# Patient Record
Sex: Male | Born: 1980 | Race: Black or African American | Hispanic: No | Marital: Single | Smoking: Current every day smoker
Health system: Southern US, Community
[De-identification: ages and names within clinical notes are randomized; demographics above are authoritative.]

## PROBLEM LIST (undated history)

## (undated) DIAGNOSIS — F329 Major depressive disorder, single episode, unspecified: Secondary | ICD-10-CM

## (undated) DIAGNOSIS — F419 Anxiety disorder, unspecified: Secondary | ICD-10-CM

## (undated) DIAGNOSIS — F32A Depression, unspecified: Secondary | ICD-10-CM

---
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
## 2003-04-12 ENCOUNTER — Emergency Department (HOSPITAL_COMMUNITY): Admission: AD | Admit: 2003-04-12 | Discharge: 2003-04-12 | Payer: Self-pay | Admitting: Family Medicine

## 2019-01-06 ENCOUNTER — Encounter (HOSPITAL_COMMUNITY): Payer: Self-pay

## 2019-01-06 ENCOUNTER — Emergency Department (HOSPITAL_COMMUNITY)
Admission: EM | Admit: 2019-01-06 | Discharge: 2019-01-06 | Disposition: A | Discharge status: Home / Self Care | Payer: HRSA Program | Attending: Emergency Medicine | Admitting: Emergency Medicine

## 2019-01-06 ENCOUNTER — Other Ambulatory Visit: Payer: Self-pay

## 2019-01-06 DIAGNOSIS — F17210 Nicotine dependence, cigarettes, uncomplicated: Secondary | ICD-10-CM | POA: Insufficient documentation

## 2019-01-06 DIAGNOSIS — Z20828 Contact with and (suspected) exposure to other viral communicable diseases: Secondary | ICD-10-CM | POA: Diagnosis not present

## 2019-01-06 DIAGNOSIS — Z20822 Contact with and (suspected) exposure to covid-19: Secondary | ICD-10-CM

## 2019-01-06 HISTORY — DX: Anxiety disorder, unspecified: F41.9

## 2019-01-06 HISTORY — DX: Depression, unspecified: F32.A

## 2019-01-06 NOTE — ED Triage Notes (Signed)
Pt states he was around a friend last night who had symptoms of COVID and was sent to the hospital to be tested. Pt states the friend's COVID test results have not come back but he wants to be tested also. Pt denies any signs or symptoms of COVID.

## 2019-01-06 NOTE — Discharge Instructions (Signed)
You will be called with the results of your COVID test in approximately 3 days.

## 2019-01-06 NOTE — ED Provider Notes (Signed)
[facility] EMERGENCY DEPARTMENT Provider Note   CSN: [id number] Arrival date & time: 01/06/19  2006   History   Chief Complaint Needs COVID testing  HPI Darik Massing is a 38 y.o. male with no significant past medical history who presents for evaluation of needing to have a testing.  Patient states his coworker tested +3 days ago.  His work is requiring him to have a negative test before he can go back to work.  He denies fever, chills, nausea, vomiting, headache, congestion, rhinorrhea, sore throat, cough, loss of taste and smell, chest pain, shortness breath, abdominal pain, diarrhea dysuria.  Denies additional aggravating or alleviating factors.  History obtained from patient and past medical records.  No interpreter was used.     HPI  Past Medical History:  Diagnosis Date  . Anxiety   . Depression     There are no active problems to display for this patient.   Past Surgical History:  Procedure Laterality Date  . HERNIA REPAIR          Home Medications    Prior to Admission medications   Not on File    Family History History reviewed. No pertinent family history.  Social History Social History   Tobacco Use  . Smoking status: Current Every Day Smoker    Types: Cigarettes  . Smokeless tobacco: Never Used  Substance Use Topics  . Alcohol use: Yes    Comment: occassional   . Drug use: Yes    Types: Cocaine, Marijuana     Allergies   Patient has no known allergies.   Review of Systems Review of Systems  Constitutional: Negative.   HENT: Negative.   Eyes: Negative.   Respiratory: Negative.   Cardiovascular: Negative.   Gastrointestinal: Negative.   Genitourinary: Negative.   Musculoskeletal: Negative.   Skin: Negative.   Neurological: Negative.   All other systems reviewed and are negative.    Physical Exam Updated Vital Signs BP 116/79   Pulse 80   Temp 98.3 F (36.8 C) (Oral)   Resp 16   SpO2 97%   Physical  Exam Vitals signs and nursing note reviewed.  Constitutional:      General: He is not in acute distress.    Appearance: He is well-developed. He is not ill-appearing, toxic-appearing or diaphoretic.  HENT:     Head: Normocephalic and atraumatic.     Nose: Nose normal.     Mouth/Throat:     Mouth: Mucous membranes are moist.     Pharynx: Oropharynx is clear.  Eyes:     Pupils: Pupils are equal, round, and reactive to light.  Neck:     Musculoskeletal: Normal range of motion and neck supple.  Cardiovascular:     Rate and Rhythm: Normal rate and regular rhythm.     Pulses: Normal pulses.     Heart sounds: Normal heart sounds.  Pulmonary:     Effort: Pulmonary effort is normal. No respiratory distress.     Breath sounds: Normal breath sounds.  Abdominal:     General: Bowel sounds are normal. There is no distension.     Palpations: Abdomen is soft.  Musculoskeletal: Normal range of motion.  Skin:    General: Skin is warm and dry.  Neurological:     Mental Status: He is alert.    ED Treatments / Results  Labs (all labs ordered are listed, but only abnormal results are displayed) Labs Reviewed  NOVEL CORONAVIRUS, NAA (HOSP ORDER, SEND-OUT  TO REF LAB; TAT 18-24 HRS)    EKG None  Radiology No results found.  Procedures Procedures (including critical care time)  Medications Ordered in ED Medications - No data to display  Initial Impression / Assessment and Plan / ED Course  I have reviewed the triage vital signs and the nursing notes.  Pertinent labs & imaging results that were available during my care of the patient were reviewed by me and considered in my medical decision making (see chart for details).  38 year old male appears otherwise well presents for evaluation of needing COVID test.  He is afebrile, nonseptic, non-ill-appearing.  Recent exposure with COVID positive patient at work 3 days ago.  He currently denies any symptoms.  Heart and lungs clear.  Tolerating  p.o. intake without difficulty.  Will do outpatient COVID test.  Discussed return precautions with patient. Patient to return for any new or worsening symptoms or he can follow-up with his PCP.       Treasa SchoolJames Moree was evaluated in Emergency Department on 01/06/2019 for the symptoms described in the history of present illness. He was evaluated in the context of the global COVID-19 pandemic, which necessitated consideration that the patient might be at risk for infection with the SARS-CoV-2 virus that causes COVID-19. Institutional protocols and algorithms that pertain to the evaluation of patients at risk for COVID-19 are in a state of rapid change based on information released by regulatory bodies including the CDC and federal and state organizations. These policies and algorithms were followed during the patient's care in the ED. Final Clinical Impressions(s) / ED Diagnoses   Final diagnoses:  Exposure to Covid-19 Virus    ED Discharge Orders    None       Payten Beaumier A, PA-C 01/06/19 2057    Maia PlanLong, Joshua G, MD 01/07/19 [phone]

## 2019-01-08 LAB — NOVEL CORONAVIRUS, NAA (HOSP ORDER, SEND-OUT TO REF LAB; TAT 18-24 HRS): SARS-CoV-2, NAA: NOT DETECTED

## 2019-03-02 ENCOUNTER — Observation Stay (HOSPITAL_COMMUNITY)
Admission: AD | Admit: 2019-03-02 | Discharge: 2019-03-03 | Disposition: A | Discharge status: Home / Self Care | Payer: Self-pay | Source: Intra-hospital | Attending: Psychiatry | Admitting: Psychiatry

## 2019-03-02 ENCOUNTER — Other Ambulatory Visit: Payer: Self-pay

## 2019-03-02 ENCOUNTER — Encounter (HOSPITAL_COMMUNITY): Payer: Self-pay | Admitting: Emergency Medicine

## 2019-03-02 ENCOUNTER — Emergency Department (HOSPITAL_COMMUNITY)
Admission: EM | Admit: 2019-03-02 | Discharge: 2019-03-02 | Disposition: A | Discharge status: Other Acute Inpatient Hospital | Payer: Self-pay | Attending: Emergency Medicine | Admitting: Emergency Medicine

## 2019-03-02 ENCOUNTER — Other Ambulatory Visit: Payer: Self-pay | Admitting: Family

## 2019-03-02 ENCOUNTER — Emergency Department (HOSPITAL_COMMUNITY): Payer: Self-pay

## 2019-03-02 DIAGNOSIS — R0789 Other chest pain: Secondary | ICD-10-CM | POA: Insufficient documentation

## 2019-03-02 DIAGNOSIS — F1994 Other psychoactive substance use, unspecified with psychoactive substance-induced mood disorder: Secondary | ICD-10-CM | POA: Diagnosis present

## 2019-03-02 DIAGNOSIS — F17210 Nicotine dependence, cigarettes, uncomplicated: Secondary | ICD-10-CM | POA: Insufficient documentation

## 2019-03-02 DIAGNOSIS — T43222A Poisoning by selective serotonin reuptake inhibitors, intentional self-harm, initial encounter: Secondary | ICD-10-CM | POA: Insufficient documentation

## 2019-03-02 DIAGNOSIS — F1910 Other psychoactive substance abuse, uncomplicated: Secondary | ICD-10-CM | POA: Insufficient documentation

## 2019-03-02 DIAGNOSIS — Z20828 Contact with and (suspected) exposure to other viral communicable diseases: Secondary | ICD-10-CM | POA: Insufficient documentation

## 2019-03-02 DIAGNOSIS — Z046 Encounter for general psychiatric examination, requested by authority: Secondary | ICD-10-CM | POA: Insufficient documentation

## 2019-03-02 DIAGNOSIS — F329 Major depressive disorder, single episode, unspecified: Secondary | ICD-10-CM | POA: Insufficient documentation

## 2019-03-02 DIAGNOSIS — G4700 Insomnia, unspecified: Secondary | ICD-10-CM | POA: Insufficient documentation

## 2019-03-02 DIAGNOSIS — Z79899 Other long term (current) drug therapy: Secondary | ICD-10-CM | POA: Insufficient documentation

## 2019-03-02 DIAGNOSIS — F419 Anxiety disorder, unspecified: Secondary | ICD-10-CM | POA: Insufficient documentation

## 2019-03-02 DIAGNOSIS — F332 Major depressive disorder, recurrent severe without psychotic features: Principal | ICD-10-CM | POA: Diagnosis present

## 2019-03-02 DIAGNOSIS — T50902A Poisoning by unspecified drugs, medicaments and biological substances, intentional self-harm, initial encounter: Secondary | ICD-10-CM | POA: Insufficient documentation

## 2019-03-02 DIAGNOSIS — X58XXXA Exposure to other specified factors, initial encounter: Secondary | ICD-10-CM | POA: Insufficient documentation

## 2019-03-02 LAB — BASIC METABOLIC PANEL
Anion gap: 14 (ref 5–15)
BUN: 15 mg/dL (ref 6–20)
CO2: 21 mmol/L — ABNORMAL LOW (ref 22–32)
Calcium: 9.3 mg/dL (ref 8.9–10.3)
Chloride: 100 mmol/L (ref 98–111)
Creatinine, Ser: 1.2 mg/dL (ref 0.61–1.24)
GFR calc Af Amer: >60 mL/min (ref >60)
GFR calc non Af Amer: >60 mL/min (ref >60)
Glucose, Bld: 74 mg/dL (ref 70–99)
Potassium: 4.6 mmol/L (ref 3.5–5.1)
Sodium: 135 mmol/L (ref 135–145)

## 2019-03-02 LAB — SARS CORONAVIRUS 2 BY RT PCR (HOSPITAL ORDER, PERFORMED IN ~~LOC~~ HOSPITAL LAB): SARS Coronavirus 2: NEGATIVE

## 2019-03-02 LAB — CBC
HCT: 43.3 % (ref 39.0–52.0)
Hemoglobin: 14.8 g/dL (ref 13.0–17.0)
MCH: 32.3 pg (ref 26.0–34.0)
MCHC: 34.2 g/dL (ref 30.0–36.0)
MCV: 94.5 fL (ref 80.0–100.0)
Platelets: 220 10*3/uL (ref 150–400)
RBC: 4.58 MIL/uL (ref 4.22–5.81)
RDW: 12.5 % (ref 11.5–15.5)
WBC: 13.1 10*3/uL — ABNORMAL HIGH (ref 4.0–10.5)
nRBC: 0 % (ref 0.0–0.2)

## 2019-03-02 LAB — ACETAMINOPHEN LEVEL: Acetaminophen (Tylenol), Serum: <10 ug/mL — ABNORMAL LOW (ref 10–30)

## 2019-03-02 LAB — HEPATIC FUNCTION PANEL
ALT: 50 U/L — ABNORMAL HIGH (ref 0–44)
AST: 40 U/L (ref 15–41)
Albumin: 4.3 g/dL (ref 3.5–5.0)
Alkaline Phosphatase: 71 U/L (ref 38–126)
Bilirubin, Direct: 0.2 mg/dL (ref 0.0–0.2)
Indirect Bilirubin: 1.2 mg/dL — ABNORMAL HIGH (ref 0.3–0.9)
Total Bilirubin: 1.4 mg/dL — ABNORMAL HIGH (ref 0.3–1.2)
Total Protein: 7.3 g/dL (ref 6.5–8.1)

## 2019-03-02 LAB — ETHANOL: Alcohol, Ethyl (B): <10 mg/dL (ref <10)

## 2019-03-02 LAB — MAGNESIUM: Magnesium: 2.2 mg/dL (ref 1.7–2.4)

## 2019-03-02 LAB — RAPID URINE DRUG SCREEN, HOSP PERFORMED
Amphetamines: NOT DETECTED
Barbiturates: NOT DETECTED
Benzodiazepines: NOT DETECTED
Cocaine: POSITIVE — AB
Opiates: NOT DETECTED
Tetrahydrocannabinol: POSITIVE — AB

## 2019-03-02 LAB — SALICYLATE LEVEL: Salicylate Lvl: <7 mg/dL (ref 2.8–30.0)

## 2019-03-02 LAB — TROPONIN I (HIGH SENSITIVITY): Troponin I (High Sensitivity): 5 ng/L (ref <18)

## 2019-03-02 MED ORDER — ACETAMINOPHEN 325 MG PO TABS: 650.0000 mg | ORAL_TABLET | Freq: Four times a day (QID) | ORAL | Status: DC | PRN

## 2019-03-02 MED ORDER — TRAZODONE HCL 100 MG PO TABS
100.0000 mg | ORAL_TABLET | Freq: Every evening | ORAL | Status: DC | PRN
  Administered 2019-03-02: 100 mg via ORAL
  Filled 2019-03-02: qty 1

## 2019-03-02 MED ORDER — SODIUM CHLORIDE 0.9% FLUSH: 3.0000 mL | Freq: Once | INTRAVENOUS | Status: DC

## 2019-03-02 MED ORDER — ALUM & MAG HYDROXIDE-SIMETH 200-200-20 MG/5ML PO SUSP: 30.0000 mL | ORAL | Status: DC | PRN

## 2019-03-02 MED ORDER — MAGNESIUM HYDROXIDE 400 MG/5ML PO SUSP: 30.0000 mL | Freq: Every day | ORAL | Status: DC | PRN

## 2019-03-02 MED ORDER — HYDROXYZINE HCL 25 MG PO TABS
25.0000 mg | ORAL_TABLET | Freq: Three times a day (TID) | ORAL | Status: DC | PRN
  Administered 2019-03-02: 25 mg via ORAL
  Filled 2019-03-02: qty 1

## 2019-03-02 NOTE — BH Assessment (Signed)
Tele Assessment Note   Patient Name: Alexander Garrison MRN: [id number] Referring Physician: Ward Location of Patient: [facility] Location of Provider: [place]  Fabio Wah is an 38 y.o. male who presented to [facility] with suicidal ideation after ingesting 100 mg of Celexa in a suicide attempt.  He stated that he "wanted to stop the pain."  Patient states that he has a history of depression with two prior suicide attempts and states that he was hospitalized at [facility] in February of this year.  Patient states that he was clean and sober for eight weeks, but states that he relapsed this week and he has used methamphetamine and cocaine as well as alcohol and marijuana.  Patient states that he was intoxicated on drugs last night which worsened his depression and he just wanted "to end it all."  Patient states that since he as arrived at the hospital that he is feeling better, but is still unable to contract for safety.  Patient denies HI/Psychosis.  He states that he was clean and sober for eight weeks prior to relapsing this week.  He states that he has been using cocaine, methamphetamine, alcohol and marijuana and his last use was last pm.  Patient states that he has no history of abuse or self-mutilation.  He states that he has not been sleeping more than five hours a night.  Patient denies any appetite disturbance.  Patient states that he is single and lives with three male roommates.  He states that he has never been married, but states that he has two sons.  Patient states that he is employed by [employer].  He states that he has pending B&E charges and Possession of a Firearm by a Felon and has court somtime in November, but he is unsure of the court date.  Patient presented as alert and oriented.  His mood and affect depressed.  He did not appear to be responding to any internal stimuli.  Patient had impaired judgment, insight and impulse control.  His thoughts were  organized and his memory intact.  His speech was coherent and his eye contact good.  His psycho-motor activity was normal.  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 Anxiety   . Depression     Past Surgical History:  Procedure Laterality Date  . HERNIA REPAIR      Family History: No family history on file.  Social History:  reports that he has been smoking cigarettes. He has never used smokeless tobacco. He reports current alcohol use. He reports current drug use. Drugs: Cocaine, Marijuana, and Methamphetamines.  Additional Social History:  Alcohol / Drug Use Pain Medications: see MAR Prescriptions: see MAR Over the Counter: see MAR History of alcohol / drug use?: Yes Longest period of sobriety (when/how long): has been clean for 8 weeks in the past Substance #1 Name of Substance 1: Methamphetamine 1 - Age of First Use: 38 1 - Amount (size/oz): $20 1 - Frequency: states that he has only used on two occasions 1 - Duration: since onset 1 - Last Use / Amount: last pm Substance #2 Name of Substance 2: Cocaine 2 - Age of First Use: 17 2 - Amount (size/oz): $50-60 2 - Frequency: 1-2 times weekly 2 - Duration: since onset 2 - Last Use / Amount: last pm Substance #3 Name of Substance 3: THC 3 - Age of First  Use: 17 3 - Amount (size/oz): 2-3 joints daily 3 - Frequency: daily 3 - Duration: since onset 3 - Last Use / Amount: yesterday Substance #4 Name of Substance 4: alcohol 4 - Age of First Use: 17 4 - Amount (size/oz): 2 beers 4 - Frequency: 1-2 times weekly 4 - Duration: since onset 4 - Last Use / Amount: yesterday  CIWA: CIWA-Ar BP: 113/69 Pulse Rate: 88 COWS:    Allergies: No Known Allergies  Home Medications: (Not in a hospital admission)   OB/GYN Status:  No LMP for male patient.  General Assessment Data Assessment unable to be  completed: Yes Reason for not completing assessment: (from previous shift) [facility] TTS Assessment: In system Is this a Tele or Face-to-Face Assessment?: Tele Assessment Is this an Initial Assessment or a Re-assessment for this encounter?: Initial Assessment Patient Accompanied by:: N/A Language Other than English: No Living Arrangements: Other (Comment)(lives in apt with three other men) What gender do you identify as?: Male Marital status: Single Living Arrangements: Non-relatives/Friends Can pt return to current living arrangement?: Yes Admission Status: Voluntary Is patient capable of signing voluntary admission?: Yes Referral Source: Self/Family/Friend Insurance type: none     Crisis Care Plan Living Arrangements: Non-relatives/Friends Legal Guardian: Other:(self) Name of Psychiatrist: none Name of Therapist: none  Education Status Is patient currently in school?: No Is the patient employed, unemployed or receiving disability?: Employed  Risk to self with the past 6 months Suicidal Ideation: Yes-Currently Present Has patient been a risk to self within the past 6 months prior to admission? : Yes Suicidal Intent: Yes-Currently Present Has patient had any suicidal intent within the past 6 months prior to admission? : Yes Is patient at risk for suicide?: Yes Suicidal Plan?: Yes-Currently Present Has patient had any suicidal plan within the past 6 months prior to admission? : Yes Specify Current Suicidal Plan: overdose Access to Means: Yes Specify Access to Suicidal Means: RX Medications What has been your use of drugs/alcohol within the last 12 months?: almost daily use Previous Attempts/Gestures: Yes How many times?: 2 Other Self Harm Risks: (drug and alcohol problem) Triggers for Past Attempts: None known Intentional Self Injurious Behavior: None Family Suicide History: No Recent stressful life event(s): (none  reported) Persecutory voices/beliefs?: No Depression: Yes Depression Symptoms: Despondent, Insomnia, Isolating, Guilt, Loss of interest in usual pleasures, Feeling worthless/self pity Substance abuse history and/or treatment for substance abuse?: No Suicide prevention information given to non-admitted patients: Not applicable  Risk to Others within the past 6 months Homicidal Ideation: No Does patient have any lifetime risk of violence toward others beyond the six months prior to admission? : No Thoughts of Harm to Others: No Current Homicidal Intent: No Current Homicidal Plan: No Access to Homicidal Means: No Identified Victim: none History of harm to others?: No Assessment of Violence: None Noted Violent Behavior Description: none Does patient have access to weapons?: No Criminal Charges Pending?: Yes Describe Pending Criminal Charges: B&E and possess Firearm by a Felon Does patient have a court date: Yes Court Date: (sometime in November) Is patient on probation?: No  Psychosis Hallucinations: None noted Delusions: None noted  Mental Status Report Appearance/Hygiene: Unremarkable Eye Contact: Good Motor Activity: Freedom of movement Speech: Logical/coherent Level of Consciousness: Alert Mood: Depressed Affect: Depressed Anxiety Level: Minimal Thought Processes: Coherent, Relevant Judgement: Impaired Orientation: Person, Place, Time, Situation Obsessive Compulsive Thoughts/Behaviors: None  Cognitive Functioning Concentration: Decreased Memory: Recent Intact, Remote Intact Is patient IDD: No Insight: Fair  Impulse Control: Fair Appetite: Good Have you had any weight changes? : No Change Sleep: Decreased Total Hours of Sleep: 5 Vegetative Symptoms: None  ADLScreening [facility] Assessment Services) Patient's cognitive ability adequate to safely complete daily activities?: Yes Patient able to express need for assistance with ADLs?: Yes Independently performs ADLs?:  Yes (appropriate for developmental age)  Prior Inpatient Therapy Prior Inpatient Therapy: Yes Prior Therapy Dates: 06/2018 Prior Therapy Facilty/Provider(s): [facility] Reason for Treatment: depression/SI  Prior Outpatient Therapy Prior Outpatient Therapy: No Does patient have an ACCT team?: No Does patient have Intensive In-House Services?  : No Does patient have [facility] services? : No Does patient have P4CC services?: No  ADL Screening (condition at time of admission) Patient's cognitive ability adequate to safely complete daily activities?: Yes Is the patient deaf or have difficulty hearing?: No Does the patient have difficulty seeing, even when wearing glasses/contacts?: No Does the patient have difficulty concentrating, remembering, or making decisions?: No Patient able to express need for assistance with ADLs?: Yes Does the patient have difficulty dressing or bathing?: No Independently performs ADLs?: Yes (appropriate for developmental age) Does the patient have difficulty walking or climbing stairs?: No Weakness of Legs: None Weakness of Arms/Hands: None  Home Assistive Devices/Equipment Home Assistive Devices/Equipment: None  Therapy Consults (therapy consults require a physician order) PT Evaluation Needed: No OT Evalulation Needed: No SLP Evaluation Needed: No Abuse/Neglect Assessment (Assessment to be complete while patient is alone) Abuse/Neglect Assessment Can Be Completed: Yes Physical Abuse: Denies Verbal Abuse: Denies Sexual Abuse: Denies Exploitation of patient/patient's resources: Denies Self-Neglect: Denies Values / Beliefs Cultural Requests During Hospitalization: None Spiritual Requests During Hospitalization: None Consults Spiritual Care Consult Needed: No Social Work Consult Needed: No Merchant navy officer (For Healthcare) Does Patient Have a Medical Advance Directive?: No Would patient like information on creating a medical advance directive?: No -  Patient declined Nutrition Screen- [facility] Adult/[facility]/AP Has the patient recently lost weight without trying?: No Has the patient been eating poorly because of a decreased appetite?: No Malnutrition Screening Tool Score: 0        Disposition: Per Dr. Lucianne Muss and Malachy Chamber, NP, overnight OBS is recommended Disposition Initial Assessment Completed for this Encounter: Yes  This service was provided via telemedicine using a 2-way, interactive audio and video technology.  Names of all persons participating in this telemedicine service and their role in this encounter. Name: Treasa School Role: patient  Name: Dannielle Huh Rylie Knierim Role: TTS  Name:  Role:   Name:  Role:     Daphene Calamity 03/02/2019 9:57 AM

## 2019-03-02 NOTE — Plan of Care (Signed)
[place] [facility] Crisis Plan  Reason for Crisis Plan:  Crisis Stabilization   Plan of Care:  Referral for Inpatient Hospitalization  Family Support:      Current Living Environment:     Insurance:   Hospital Account    Name Acct ID Class Status Primary Coverage   [place], [place] [id number] [place] Open None        Guarantor Account (for Hospital Account [id number])    Name Relation to Pt Service Area Active? Acct Type   Lyndon Code [facility]   Address Phone       [address] [place], [place] [address] [phone])          Coverage Information (for Hospital Account [id number])    Not on file      Legal Guardian:     Primary Care Provider:  Patient, No Pcp Per  Current Outpatient Providers:  Hampton Abbot, MD  Psychiatrist:     Counselor/Therapist:     Compliant with Medications:  Yes  Additional Information:   Jesusita Oka 10/23/20208:55 PM

## 2019-03-02 NOTE — Discharge Instructions (Signed)
Go to behavioral health for admission.

## 2019-03-02 NOTE — ED Provider Notes (Addendum)
TIME SEEN: 6:00 AM  CHIEF COMPLAINT: Intentional overdose  HPI: Patient is a 38 year old male with history of depression and anxiety who presents to the emergency department after an intentional overdose.  States last night he took 5 Celexa tablets that were 20 mg each around 11 PM in an attempt to "stop all of the pain".  He denies that it was a suicide attempt.  He states he is feeling better now but has felt very depressed recently.  States he also used cocaine and methamphetamine around the same time and then developed left-sided chest pain that he is unable to describe.  Chest pain is also resolved.  No associated shortness of breath, lightheadedness, nausea or vomiting, diaphoresis.  No recent fevers, cough, diarrhea.  ROS: See HPI Constitutional: no fever  Eyes: no drainage  ENT: no runny nose   Cardiovascular:   chest pain  Resp: no SOB  GI: no vomiting GU: no dysuria Integumentary: no rash  Allergy: no hives  Musculoskeletal: no leg swelling  Neurological: no slurred speech ROS otherwise negative  PAST MEDICAL HISTORY/PAST SURGICAL HISTORY:  Past Medical History:  Diagnosis Date  . Anxiety   . Depression     MEDICATIONS:  Prior to Admission medications   Not on File    ALLERGIES:  No Known Allergies  SOCIAL HISTORY:  Social History   Tobacco Use  . Smoking status: Current Every Day Smoker    Types: Cigarettes  . Smokeless tobacco: Never Used  Substance Use Topics  . Alcohol use: Yes    Comment: occassional     FAMILY HISTORY: No family history on file.  EXAM: BP 113/69 (BP Location: Left Arm)   Pulse 88   Temp 98.2 F (36.8 C) (Oral)   Resp 20   SpO2 97%  CONSTITUTIONAL: Alert and oriented and responds appropriately to questions. Well-appearing; well-nourished HEAD: Normocephalic EYES: Conjunctivae clear, pupils appear equal, EOMI ENT: normal nose; moist mucous membranes NECK: Supple, no meningismus, no nuchal rigidity, no LAD  CARD: RRR; S1 and  S2 appreciated; no murmurs, no clicks, no rubs, no gallops RESP: Normal chest excursion without splinting or tachypnea; breath sounds clear and equal bilaterally; no wheezes, no rhonchi, no rales, no hypoxia or respiratory distress, speaking full sentences ABD/GI: Normal bowel sounds; non-distended; soft, non-tender, no rebound, no guarding, no peritoneal signs, no hepatosplenomegaly BACK:  The back appears normal and is non-tender to palpation, there is no CVA tenderness EXT: Normal ROM in all joints; non-tender to palpation; no edema; normal capillary refill; no cyanosis, no calf tenderness or swelling    SKIN: Normal color for age and race; warm; no rash NEURO: Moves all extremities equally PSYCH: Flat affect.  Denies SI, HI or hallucinations currently.  MEDICAL DECISION MAKING: Patient here after an intentional overdose.  Discussed with poison control.  Patient medically cleared 6 hours after ingestion as long as labs, EKG normal.  Poison control recommends looking for QRS and QT prolongation.  EKG today is normal.  There is also no ischemic change.  Low suspicion for ACS, PE or dissection.  Once medically cleared, will consult TTS.  ED PROGRESS: Patient's labs are unremarkable.  No QRS or QT prolongation on EKG.  He is now medically cleared and awaiting TTS evaluation.  Patient is here voluntarily.  Suspect patient will need admission to behavioral health hospital for psychiatric treatment given this was an unintentional overdose although he denies that it was a suicide attempt.  Will obtain rapid Covid swab.  Troponin  negative.  No longer having chest pain.  Suspect related to his illicit drug use.  I do not feel he needs a second troponin given chest pain started 7 hours ago.  Chest x-ray clear without edema, pneumonia, pneumothorax.  I reviewed all nursing notes and pertinent previous records as available.  I have interpreted any EKGs, lab and urine results, imaging (as available).    EKG  Interpretation  Date/Time:  Friday March 02 2019 06:05:47 EDT Ventricular Rate:  89 PR Interval:  160 QRS Duration: 88 QT Interval:  390 QTC Calculation: 474 R Axis:   62 Text Interpretation:  Normal sinus rhythm Normal ECG No old tracing to compare Confirmed by Samier Jaco, Cyril Mourning [phone]) on 03/02/2019 6:19:49 AM        Alexander Garrison was evaluated in Emergency Department on 03/02/2019 for the symptoms described in the history of present illness. He was evaluated in the context of the global COVID-19 pandemic, which necessitated consideration that the patient might be at risk for infection with the SARS-CoV-2 virus that causes COVID-19. Institutional protocols and algorithms that pertain to the evaluation of patients at risk for COVID-19 are in a state of rapid change based on information released by regulatory bodies including the CDC and federal and state organizations. These policies and algorithms were followed during the patient's care in the ED.    Alexander Garrison, Delice Bison, DO 03/02/19 5400    Alexander Garrison, Delice Bison, DO 03/02/19 [id number]

## 2019-03-02 NOTE — ED Notes (Signed)
Lunch Tray Ordered 

## 2019-03-02 NOTE — Progress Notes (Signed)
Patient ID: Alexander Garrison, male   DOB: [birth year], 38 y.o.   MRN: [id number] Pt A&O x 4, transfer from [place], presents with SI, after ingesting 100 mg of Celexa.,  Pt states he wanted to stop the pain.  Pt with history of 2 previous SI attempts.  Pt was clean & Sober for eith weeks, relapsed this week, on alcohol, marijuana, Meth and Cocaine.  Skin search completed.  Pt calm & cooperative.  Monitoring for safety.

## 2019-03-02 NOTE — ED Notes (Signed)
Pt changed into burgundy tshirt, has pants on due to no paper scrubs available

## 2019-03-02 NOTE — ED Notes (Signed)
PT to purple for TTS

## 2019-03-02 NOTE — ED Notes (Signed)
Patient was given 324mg  ASA and 2 nitros en route to ED.

## 2019-03-02 NOTE — Consult Note (Signed)
Telepsych Consultation   Reason for Consult:  overdose Referring Physician:  EDP Location of Patient: [facility] Location of Provider: Behavioral Health TTS Department  Patient Identification: Alexander Garrison MRN:  [id number] Principal Diagnosis: <principal problem not specified> Diagnosis:  Active Problems:   * No active hospital problems. *   Total Time spent with patient: 30 minutes  Subjective:   Alexander Garrison is a 38 y.o. male patient admitted with intentional overdose on Celexa.  Patient states quotation marks I took a lot of medication.  I was on drugs and tried to hurt myself I reckoned.  I was suicidal for about 2 days..  Patient with history of depression, anxiety, and polysubstance abuse.  He reports being stable and is currently living in an apartment that is provided by a sober living of [place].  He works at [employer] in [place].  He reports prior to this incident he had been clean for 8 weeks, and relapsed.  He does identify multiple stressors to include fussing with his significant other, legal charges, substance relapse, and depression.  He has 2 previous suicide attempts both overdose.  1 previous overdose was on medication, second overdose was on heroin that required medical interventions to include Narcan and CPR.  He reports relapsing on about $50-$60 worth of cocaine, and after the argument with his significant other he decided to take the medication.  Patient does give more information about his legal charges to include breaking and entering, stolen Zenaida Niecevan, firearm by a felon.  He states he presented to the hospital to get back started on his medications and stay clean.  At this time he is denying any suicidal, homicidal, and or auditory visual hallucinations.  HPI:  Alexander Garrison is an 38 y.o. male who presented to [facility] with suicidal ideation after ingesting 100 mg of Celexa in a suicide attempt.  He stated that he "wanted to stop the pain."  Patient states that he has a history of  depression with two prior suicide attempts and states that he was [facility] this [facility].  Patient states that he was clean and sober for eight weeks, but states that he relapsed this week and he has used methamphetamine and cocaine as well as alcohol and marijuana.  Patient states that he was intoxicated on drugs last night which worsened his depression and he just wanted "to end it all."  Patient states that since he as arrived at the hospital that he is feeling better, but is still unable to contract for safety.  Patient denies HI/Psychosis.  He states that he was clean and sober for eight weeks prior to relapsing this week.  He states that he has been using cocaine, methamphetamine, alcohol and marijuana and his last use was last pm.  Patient states that he has no history of abuse or self-mutilation.  He states that he has not been sleeping more than five hours a night.  Patient denies any appetite disturbance.  Patient states that he is single and lives with three male roommates.  He states that he has never been married, but states that he has two sons.  Patient states that he is employed by [employer].  He states that he has pending B&E charges and Possession of a Firearm by a Felon and has court somtime in November, but he is unsure of the court date.   Past Psychiatric History: Depression, anxiety, substance abuse, cocaine use disorder  Risk to Self: Suicidal Ideation: Yes-Currently Present  Suicidal Intent: Yes-Currently Present Is patient at risk for suicide?: Yes Suicidal Plan?: Yes-Currently Present Specify Current Suicidal Plan: overdose Access to Means: Yes Specify Access to Suicidal Means: RX Medications What has been your use of drugs/alcohol within the last 12 months?: almost daily use How many times?: 2 Other Self Harm Risks: (drug and alcohol problem) Triggers for Past Attempts: None known Intentional Self Injurious Behavior: None Risk to  Others: Homicidal Ideation: No Thoughts of Harm to Others: No Current Homicidal Intent: No Current Homicidal Plan: No Access to Homicidal Means: No Identified Victim: none History of harm to others?: No Assessment of Violence: None Noted Violent Behavior Description: none Does patient have access to weapons?: No Criminal Charges Pending?: Yes Describe Pending Criminal Charges: B&E and possess Firearm by a Felon Does patient have a court date: Yes Court Date: (sometime in November) Prior Inpatient Therapy: Prior Inpatient Therapy: Yes Prior Therapy Dates: 06/2018 Prior Therapy Facilty/Provider(s): [facility] Reason for Treatment: depression/SI Prior Outpatient Therapy: Prior Outpatient Therapy: No Does patient have an ACCT team?: No Does patient have Intensive In-House Services?  : No Does patient have [facility] services? : No Does patient have P4CC services?: No  Past Medical History:  Past Medical History:  Diagnosis Date  . Anxiety   . Depression     Past Surgical History:  Procedure Laterality Date  . HERNIA REPAIR     Family History: No family history on file. Family Psychiatric  History: As per patient mother takes depression medicine.  He denies any suicide attempts and/or completions by family. Social History:  Social History   Substance and Sexual Activity  Alcohol Use Yes   Comment: occassional      Social History   Substance and Sexual Activity  Drug Use Yes  . Types: Cocaine, Marijuana, Methamphetamines   Comment: THC daily and cocaine/meth 1-2 x week    Social History   Socioeconomic History  . Marital status: Single    Spouse name: Not on file  . Number of children: Not on file  . Years of education: Not on file  . Highest education level: Not on file  Occupational History  . Not on file  Social Needs  . Financial resource strain: Not on file  . Food insecurity    Worry: Not on file    Inability: Not on file  . Transportation needs    Medical: Not  on file    Non-medical: Not on file  Tobacco Use  . Smoking status: Current Every Day Smoker    Types: Cigarettes  . Smokeless tobacco: Never Used  Substance and Sexual Activity  . Alcohol use: Yes    Comment: occassional   . Drug use: Yes    Types: Cocaine, Marijuana, Methamphetamines    Comment: THC daily and cocaine/meth 1-2 x week  . Sexual activity: Not on file  Lifestyle  . Physical activity    Days per week: Not on file    Minutes per session: Not on file  . Stress: Not on file  Relationships  . Social Herbalist on phone: Not on file    Gets together: Not on file    Attends religious service: Not on file    Active member of club or organization: Not on file    Attends meetings of clubs or organizations: Not on file    Relationship status: Not on file  Other Topics Concern  . Not on file  Social History Narrative  . Not on  file   Additional Social History:    Allergies:  No Known Allergies  Labs:  Results for orders placed or performed during the hospital encounter of 03/02/19 (from the past 48 hour(s))  Basic metabolic panel     Status: Abnormal   Collection Time: 03/02/19  5:09 AM  Result Value Ref Range   Sodium 135 135 - 145 mmol/L   Potassium 4.6 3.5 - 5.1 mmol/L   Chloride 100 98 - 111 mmol/L   CO2 21 (L) 22 - 32 mmol/L   Glucose, Bld 74 70 - 99 mg/dL   BUN 15 6 - 20 mg/dL   Creatinine, Ser 4.09 0.61 - 1.24 mg/dL   Calcium 9.3 8.9 - 81.1 mg/dL   GFR calc non Af Amer >60 >60 mL/min   GFR calc Af Amer >60 >60 mL/min   Anion gap 14 5 - 15    Comment: Performed at [facility], [address], [place], [place] [address]  CBC     Status: Abnormal   Collection Time: 03/02/19  5:09 AM  Result Value Ref Range   WBC 13.1 (H) 4.0 - 10.5 K/uL   RBC 4.58 4.22 - 5.81 MIL/uL   Hemoglobin 14.8 13.0 - 17.0 g/dL   HCT 29.5 62.1 - 30.8 %   MCV 94.5 80.0 - 100.0 fL   MCH 32.3 26.0 - 34.0 pg   MCHC 34.2 30.0 - 36.0 g/dL   RDW 65.7 84.6 - 96.2 %    Platelets 220 150 - 400 K/uL   nRBC 0.0 0.0 - 0.2 %    Comment: Performed at [facility], [address], [place], [place] [address]  Troponin I (High Sensitivity)     Status: None   Collection Time: 03/02/19  5:09 AM  Result Value Ref Range   Troponin I (High Sensitivity) 5 <18 ng/L    Comment: (NOTE) Elevated high sensitivity troponin I (hsTnI) values and significant  changes across serial measurements may suggest ACS but many other  chronic and acute conditions are known to elevate hsTnI results.  Refer to the "Links" section for chest pain algorithms and additional  guidance. Performed at [facility], [address], [place], [place] [address]   Hepatic function panel     Status: Abnormal   Collection Time: 03/02/19  5:09 AM  Result Value Ref Range   Total Protein 7.3 6.5 - 8.1 g/dL   Albumin 4.3 3.5 - 5.0 g/dL   AST 40 15 - 41 U/L   ALT 50 (H) 0 - 44 U/L   Alkaline Phosphatase 71 38 - 126 U/L   Total Bilirubin 1.4 (H) 0.3 - 1.2 mg/dL   Bilirubin, Direct 0.2 0.0 - 0.2 mg/dL   Indirect Bilirubin 1.2 (H) 0.3 - 0.9 mg/dL    Comment: Performed at [facility], [address] [place], [place] [address]  Magnesium     Status: None   Collection Time: 03/02/19  5:09 AM  Result Value Ref Range   Magnesium 2.2 1.7 - 2.4 mg/dL    Comment: Performed at [facility], [address], [place], [place] [address]  Ethanol     Status: None   Collection Time: 03/02/19  5:42 AM  Result Value Ref Range   Alcohol, Ethyl (B) <10 <10 mg/dL    Comment: (NOTE) Lowest detectable limit for serum alcohol is 10 mg/dL. For medical purposes only. Performed at [facility], [address]  [place], [place] [address]   Salicylate level     Status: None   Collection Time: 03/02/19  5:42 AM  Result Value Ref Range   Salicylate Lvl <7.0 2.8 - 30.0 mg/dL    Comment: Performed at [facility], [address] [place], [place] [address]  Acetaminophen level      Status: Abnormal   Collection Time: 03/02/19  5:42 AM  Result Value Ref Range   Acetaminophen (Tylenol), Serum <10 (L) 10 - 30 ug/mL    Comment: (NOTE) Therapeutic concentrations vary significantly. A range of 10-30 ug/mL  may be an effective concentration for many patients. However, some  are best treated at concentrations outside of this range. Acetaminophen concentrations >150 ug/mL at 4 hours after ingestion  and >50 ug/mL at 12 hours after ingestion are often associated with  toxic reactions. Performed at [facility], [address], [place], [place] [address]   SARS Coronavirus 2 by RT PCR (hospital order, performed in [facility] hospital lab) Nasopharyngeal Nasopharyngeal Swab     Status: None   Collection Time: 03/02/19  8:00 AM   Specimen: Nasopharyngeal Swab  Result Value Ref Range   SARS Coronavirus 2 NEGATIVE NEGATIVE    Comment: (NOTE) If result is NEGATIVE SARS-CoV-2 target nucleic acids are NOT DETECTED. The SARS-CoV-2 RNA is generally detectable in upper and lower  respiratory specimens during the acute phase of infection. The lowest  concentration of SARS-CoV-2 viral copies this assay can detect is 250  copies / mL. A negative result does not preclude SARS-CoV-2 infection  and should not be used as the sole basis for treatment or other  patient management decisions.  A negative result may occur with  improper specimen collection / handling, submission of specimen other  than nasopharyngeal swab, presence of viral mutation(s) within the  areas targeted by this assay, and inadequate number of viral copies  (<250 copies / mL). A negative result must be combined with clinical  observations, patient history, and epidemiological information. If result is POSITIVE SARS-CoV-2 target nucleic acids are DETECTED. The SARS-CoV-2 RNA is generally detectable in upper and lower  respiratory specimens dur ing the acute phase of infection.  Positive  results are  indicative of active infection with SARS-CoV-2.  Clinical  correlation with patient history and other diagnostic information is  necessary to determine patient infection status.  Positive results do  not rule out bacterial infection or co-infection with other viruses. If result is PRESUMPTIVE POSTIVE SARS-CoV-2 nucleic acids MAY BE PRESENT.   A presumptive positive result was obtained on the submitted specimen  and confirmed on repeat testing.  While 2019 novel coronavirus  (SARS-CoV-2) nucleic acids may be present in the submitted sample  additional confirmatory testing may be necessary for epidemiological  and / or clinical management purposes  to differentiate between  SARS-CoV-2 and other Sarbecovirus currently known to infect humans.  If clinically indicated additional testing with an alternate test  methodology [phone]) is advised. The SARS-CoV-2 RNA is generally  detectable in upper and lower respiratory sp ecimens during the acute  phase of infection. The expected result is Negative. Fact Sheet for Patients:  BoilerBrush.com.cy Fact Sheet for Healthcare Providers: https://pope.com/ This test is not yet approved or cleared by the [place] FDA and has been authorized for detection and/or diagnosis of SARS-CoV-2 by FDA under an Emergency Use Authorization (EUA).  This EUA will remain in effect (meaning this test can be used) for the duration of the  COVID-19 declaration under Section 564(b)(1) of the Act, 21 U.S.C. section 360bbb-3(b)(1), unless the authorization is terminated or revoked sooner. Performed at [facility], [address], [place], [place] [address]     Medications:  Current Facility-Administered Medications  Medication Dose Route Frequency Provider Last Rate Last Dose  . sodium chloride flush (NS) 0.9 % injection 3 mL  3 mL Intravenous Once Ward, Kristen N, DO       No current outpatient medications  on file.    Musculoskeletal: Strength & Muscle Tone: within normal limits Gait & Station: normal Patient leans: N/A  Psychiatric Specialty Exam: Physical Exam  ROS  Blood pressure 113/69, pulse 88, temperature 98.2 F (36.8 C), temperature source Oral, resp. rate 20, SpO2 97 %.There is no height or weight on file to calculate BMI.  General Appearance: Fairly Groomed  Eye Contact:  Fair  Speech:  Clear and Coherent and Normal Rate  Volume:  Normal  Mood:  Depressed  Affect:  Flat  Thought Process:  Coherent, Linear and Descriptions of Associations: Intact  Orientation:  Full (Time, Place, and Person)  Thought Content:  Logical  Suicidal Thoughts:  No  Homicidal Thoughts:  No  Memory:  Immediate;   Fair Recent;   Fair  Judgement:  Poor  Insight:  Lacking and Shallow  Psychomotor Activity:  Normal  Concentration:  Concentration: Fair and Attention Span: Fair  Recall:  [employer] of Knowledge:  Fair  Language:  Fair  Akathisia:  No  Handed:  Right  AIMS (if indicated):     Assets:  Communication Skills Desire for Improvement Financial Resources/Insurance Leisure Time Physical Health Social Support Vocational/Educational  ADL's:  Intact  Cognition:  WNL  Sleep:        Treatment Plan Summary: Daily contact with patient to assess and evaluate symptoms and progress in treatment, Medication management and Plan Recommend admit to observation. will need short term drug rehab services. wil resume home medications at this time, once admitted to the unit. he recently overdosed on celexa will not restart this medication today.   Disposition: Supportive therapy provided about ongoing stressors. Refer to IOP. Discussed crisis plan, support from social network, calling 911, coming to the Emergency Department, and calling Suicide Hotline. admit to OBS  This service was provided via telemedicine using a 2-way, interactive audio and video technology.  Names of all persons  participating in this telemedicine service and their role in this encounter. Name: Alexander School Role: Patient  Name: Caryn Bee Role: FNP-BC    Maryagnes Amos, FNP 03/02/2019 11:43 AM

## 2019-03-02 NOTE — Progress Notes (Signed)
Pt accepted to [facility] O[facility]; bed Anthony, NP is the accepting provider.   Dr. Dwyane Dee is the attending provider.   Call report to [phone]   [facility] ED notified.    Pt is voluntary and will be transported by [facility] [facility] Pt may arrive at [facility] as soon as transportation can be arranged.   Audree Camel, LCSW, [place] Disposition [facility]/TTS [phone]

## 2019-03-02 NOTE — ED Notes (Signed)
Breakfast tray ordered 

## 2019-03-02 NOTE — ED Triage Notes (Signed)
Patient was picked up at a [employer], waiting on someone to pick him up.  Patient complaining of chest pain, taken Cocaine and meth for the last two days.  He has taken a Celexa, stated to EMS that he just wanted to go to sleep.  He was having some depression.

## 2019-03-03 DIAGNOSIS — F1994 Other psychoactive substance use, unspecified with psychoactive substance-induced mood disorder: Secondary | ICD-10-CM | POA: Diagnosis present

## 2019-03-03 DIAGNOSIS — F1910 Other psychoactive substance abuse, uncomplicated: Secondary | ICD-10-CM

## 2019-03-03 LAB — LIPID PANEL
Cholesterol: 240 mg/dL — ABNORMAL HIGH (ref 0–200)
HDL: 40 mg/dL — ABNORMAL LOW (ref >40)
LDL Cholesterol: 173 mg/dL — ABNORMAL HIGH (ref 0–99)
Total CHOL/HDL Ratio: 6 RATIO
Triglycerides: 135 mg/dL (ref <150)
VLDL: 27 mg/dL (ref 0–40)

## 2019-03-03 LAB — HEMOGLOBIN A1C
Hgb A1c MFr Bld: 5.4 % (ref 4.8–5.6)
Mean Plasma Glucose: 108.28 mg/dL

## 2019-03-03 LAB — TSH: TSH: 1.84 u[IU]/mL (ref 0.350–4.500)

## 2019-03-03 NOTE — H&P (Signed)
BH Observation Unit Provider Admission PAA/H&P  Patient Identification: Alexander Garrison MRN:  [id number] Date of Evaluation:  03/03/2019 Chief Complaint:  MDD Principal Diagnosis: MDD (major depressive disorder), recurrent episode, severe (HCC) Diagnosis:  Principal Problem:   MDD (major depressive disorder), recurrent episode, severe (HCC) Active Problems:   Polysubstance abuse (HCC)  History of Present Illness:   Alexander HummerJames Locklearis an 38 y.o.male who [facility] with suicidal ideation after ingesting 100 mg of Celexa in a suicide attempt. He stated that he "wanted to stop the pain." Patient states that he has a history of depression with two prior suicide attempts and states that he was [facility] in [facility] of this year. Patient states that he was clean and sober for eight weeks, but states that he relapsed this week and he has used methamphetamine and cocaine as well as alcohol and marijuana. Patient states that he was intoxicated on drugs last night which worsened his depression and he just wanted "to end it all." Patient states that since he as arrived at the hospital that he is feeling better, but is still unable to contract for safety. Patient denies HI/Psychosis. He states that he was clean and sober for eight weeks prior to relapsing this week. He states that he has been using cocaine, methamphetamine, alcohol and marijuana and his last use was last pm. Patient states that he has no history of abuse or self-mutilation. He states that he has not been sleeping more than five hours a night. Patient denies any appetite disturbance.  Patient states that he is single and lives with three male roommates. He states that he has never been married, but states that he has two sons. Patient states that he is employed by [employer]. He states that he has pending B&E charges and Possession of a Firearm by a Felon and has court somtime in November, but he is  unsure of the court date.   Alexander Garrison is a 38 y.o. male patient admitted with intentional overdose on Celexa.  Patient states quotation marks I took a lot of medication.  I was on drugs and tried to hurt myself I reckoned.  I was suicidal for about 2 days..  Patient with history of depression, anxiety, and polysubstance abuse.  He reports being stable and is currently living in an apartment that is provided by a sober living of [place].  He works at [employer] in [place].  He reports prior to this incident he had been clean for 8 weeks, and relapsed.  He does identify multiple stressors to include fussing with his significant other, legal charges, substance relapse, and depression.  He has 2 previous suicide attempts both overdose.  1 previous overdose was on medication, second overdose was on heroin that required medical interventions to include Narcan and CPR.  He reports relapsing on about $50-$60 worth of cocaine, and after the argument with his significant other he decided to take the medication.  Patient does give more information about his legal charges to include breaking and entering, stolen Zenaida Niecevan, firearm by a felon.  He states he presented to the hospital to get back started on his medications and stay clean.  At this time he is denying any suicidal, homicidal, and or auditory visual hallucinations.  Patient is alert and oriented x 4, pleasant, and cooperative. Speech is clear and coherent. Mood is depressed and affect is congruent with mood. Thought process is coherent and thought content is logical. Denies suicidal ideations. Denies homicidal  ideations. Denies audiovisual hallucinations. No indication that patient is responding to internal stimuli.   Associated Signs/Symptoms: Depression Symptoms:  depressed mood, anhedonia, insomnia, feelings of worthlessness/guilt, difficulty concentrating, hopelessness, suicidal attempt, anxiety, (Hypo) Manic Symptoms:  Impulsivity, Anxiety  Symptoms:  Excessive Worry, Psychotic Symptoms:  none present PTSD Symptoms: Negative Total Time spent with patient: 20 minutes  Past Psychiatric History: Depression, anxiety, substance abuse, cocaine use disorder  Is the patient at risk to self? Yes.    Has the patient been a risk to self in the past 6 months? No.  Has the patient been a risk to self within the distant past? Yes.    Is the patient a risk to others? No.  Has the patient been a risk to others in the past 6 months? No.  Has the patient been a risk to others within the distant past? No.   Prior Inpatient Therapy:   Prior Outpatient Therapy:    Alcohol Screening: 1. How often do you have a drink containing alcohol?: Monthly or less 2. How many drinks containing alcohol do you have on a typical day when you are drinking?: 3 or 4 3. How often do you have six or more drinks on one occasion?: Daily or almost daily AUDIT-C Score: 6 4. How often during the last year have you found that you were not able to stop drinking once you had started?: Less than monthly 5. How often during the last year have you failed to do what was normally expected from you becasue of drinking?: Less than monthly 6. How often during the last year have you needed a first drink in the morning to get yourself going after a heavy drinking session?: Less than monthly 7. How often during the last year have you had a feeling of guilt of remorse after drinking?: Less than monthly 8. How often during the last year have you been unable to remember what happened the night before because you had been drinking?: Less than monthly 9. Have you or someone else been injured as a result of your drinking?: No 10. Has a relative or friend or a doctor or another health worker been concerned about your drinking or suggested you cut down?: No Alcohol Use Disorder Identification Test Final Score (AUDIT): 11 Alcohol Brief Interventions/Follow-up: Alcohol Education, Brief  Advice, Continued Monitoring Substance Abuse History in the last 12 months:  Yes.   Consequences of Substance Abuse: Negative Previous Psychotropic Medications: Yes  Psychological Evaluations: No  Past Medical History:  Past Medical History:  Diagnosis Date  . Anxiety   . Depression     Past Surgical History:  Procedure Laterality Date  . HERNIA REPAIR     Family History: History reviewed. No pertinent family history. Family Psychiatric History: As per patient mother takes depression medicine.  He denies any suicide attempts and/or completions by family. Tobacco Screening:   Social History:  Social History   Substance and Sexual Activity  Alcohol Use Yes   Comment: occassional      Social History   Substance and Sexual Activity  Drug Use Yes  . Types: Cocaine, Marijuana, Methamphetamines   Comment: THC daily and cocaine/meth 1-2 x week    Additional Social History:                           Allergies:  No Known Allergies Lab Results:  Results for orders placed or performed during the hospital encounter of 03/02/19 (from the  past 48 hour(s))  Basic metabolic panel     Status: Abnormal   Collection Time: 03/02/19  5:09 AM  Result Value Ref Range   Sodium 135 135 - 145 mmol/L   Potassium 4.6 3.5 - 5.1 mmol/L   Chloride 100 98 - 111 mmol/L   CO2 21 (L) 22 - 32 mmol/L   Glucose, Bld 74 70 - 99 mg/dL   BUN 15 6 - 20 mg/dL   Creatinine, Ser 5.59 0.61 - 1.24 mg/dL   Calcium 9.3 8.9 - 74.1 mg/dL   GFR calc non Af Amer >60 >60 mL/min   GFR calc Af Amer >60 >60 mL/min   Anion gap 14 5 - 15    Comment: Performed at [facility], [address], [place], [place] [address]  CBC     Status: Abnormal   Collection Time: 03/02/19  5:09 AM  Result Value Ref Range   WBC 13.1 (H) 4.0 - 10.5 K/uL   RBC 4.58 4.22 - 5.81 MIL/uL   Hemoglobin 14.8 13.0 - 17.0 g/dL   HCT 36.4 68.0 - 32.1 %   MCV 94.5 80.0 - 100.0 fL   MCH 32.3 26.0 - 34.0 pg   MCHC 34.2 30.0 -  36.0 g/dL   RDW 22.4 82.5 - 00.3 %   Platelets 220 150 - 400 K/uL   nRBC 0.0 0.0 - 0.2 %    Comment: Performed at [facility] Lab, [address], [place], [place] [address]  Troponin I (High Sensitivity)     Status: None   Collection Time: 03/02/19  5:09 AM  Result Value Ref Range   Troponin I (High Sensitivity) 5 <18 ng/L    Comment: (NOTE) Elevated high sensitivity troponin I (hsTnI) values and significant  changes across serial measurements may suggest ACS but many other  chronic and acute conditions are known to elevate hsTnI results.  Refer to the "Links" section for chest pain algorithms and additional  guidance. Performed at [facility], [address], [place], [place] [address]   Hepatic function panel     Status: Abnormal   Collection Time: 03/02/19  5:09 AM  Result Value Ref Range   Total Protein 7.3 6.5 - 8.1 g/dL   Albumin 4.3 3.5 - 5.0 g/dL   AST 40 15 - 41 U/L   ALT 50 (H) 0 - 44 U/L   Alkaline Phosphatase 71 38 - 126 U/L   Total Bilirubin 1.4 (H) 0.3 - 1.2 mg/dL   Bilirubin, Direct 0.2 0.0 - 0.2 mg/dL   Indirect Bilirubin 1.2 (H) 0.3 - 0.9 mg/dL    Comment: Performed at [facility] Lab, [address] [place], [place] [address]  Magnesium     Status: None   Collection Time: 03/02/19  5:09 AM  Result Value Ref Range   Magnesium 2.2 1.7 - 2.4 mg/dL    Comment: Performed at [facility] Lab, [address], [place], [place] [address]  Ethanol     Status: None   Collection Time: 03/02/19  5:42 AM  Result Value Ref Range   Alcohol, Ethyl (B) <10 <10 mg/dL    Comment: (NOTE) Lowest detectable limit for serum alcohol is 10 mg/dL. For medical purposes only. Performed at [facility], [address] [place], [place] [address]   Salicylate level     Status: None   Collection Time: 03/02/19  5:42 AM  Result Value Ref Range   Salicylate Lvl <7.0  2.8 - 30.0 mg/dL    Comment: Performed at [facility] Lab, [address] [place],  [place] [address]  Acetaminophen level     Status: Abnormal   Collection Time: 03/02/19  5:42 AM  Result Value Ref Range   Acetaminophen (Tylenol), Serum <10 (L) 10 - 30 ug/mL    Comment: (NOTE) Therapeutic concentrations vary significantly. A range of 10-30 ug/mL  may be an effective concentration for many patients. However, some  are best treated at concentrations outside of this range. Acetaminophen concentrations >150 ug/mL at 4 hours after ingestion  and >50 ug/mL at 12 hours after ingestion are often associated with  toxic reactions. Performed at [facility] Lab, [address] [place], [place] [address]   SARS Coronavirus 2 by RT PCR (hospital order, performed in [facility] hospital lab) Nasopharyngeal Nasopharyngeal Swab     Status: None   Collection Time: 03/02/19  8:00 AM   Specimen: Nasopharyngeal Swab  Result Value Ref Range   SARS Coronavirus 2 NEGATIVE NEGATIVE    Comment: (NOTE) If result is NEGATIVE SARS-CoV-2 target nucleic acids are NOT DETECTED. The SARS-CoV-2 RNA is generally detectable in upper and lower  respiratory specimens during the acute phase of infection. The lowest  concentration of SARS-CoV-2 viral copies this assay can detect is 250  copies / mL. A negative result does not preclude SARS-CoV-2 infection  and should not be used as the sole basis for treatment or other  patient management decisions.  A negative result may occur with  improper specimen collection / handling, submission of specimen other  than nasopharyngeal swab, presence of viral mutation(s) within the  areas targeted by this assay, and inadequate number of viral copies  (<250 copies / mL). A negative result must be combined with clinical  observations, patient history, and epidemiological information. If result is POSITIVE SARS-CoV-2 target nucleic acids are DETECTED. The SARS-CoV-2 RNA is generally detectable in upper and lower  respiratory specimens dur ing the acute phase of  infection.  Positive  results are indicative of active infection with SARS-CoV-2.  Clinical  correlation with patient history and other diagnostic information is  necessary to determine patient infection status.  Positive results do  not rule out bacterial infection or co-infection with other viruses. If result is PRESUMPTIVE POSTIVE SARS-CoV-2 nucleic acids MAY BE PRESENT.   A presumptive positive result was obtained on the submitted specimen  and confirmed on repeat testing.  While 2019 novel coronavirus  (SARS-CoV-2) nucleic acids may be present in the submitted sample  additional confirmatory testing may be necessary for epidemiological  and / or clinical management purposes  to differentiate between  SARS-CoV-2 and other Sarbecovirus currently known to infect humans.  If clinically indicated additional testing with an alternate test  methodology [phone]) is advised. The SARS-CoV-2 RNA is generally  detectable in upper and lower respiratory sp ecimens during the acute  phase of infection. The expected result is Negative. Fact Sheet for Patients:  BoilerBrush.com.cy Fact Sheet for Healthcare Providers: https://pope.com/ This test is not yet approved or cleared by the [place] FDA and has been authorized for detection and/or diagnosis of SARS-CoV-2 by FDA under an Emergency Use Authorization (EUA).  This EUA will remain in effect (meaning this test can be used) for the duration of the COVID-19 declaration under Section 564(b)(1) of the Act, 21 U.S.C. section 360bbb-3(b)(1), unless the authorization is terminated or revoked sooner. Performed at [facility] Lab, [address], [place],  [place] [address]   Rapid urine drug screen (hospital performed)     Status: Abnormal   Collection Time: 03/02/19 11:40 AM  Result Value Ref Range   Opiates NONE DETECTED NONE DETECTED   Cocaine POSITIVE (A) NONE DETECTED   Benzodiazepines  NONE DETECTED NONE DETECTED   Amphetamines NONE DETECTED NONE DETECTED   Tetrahydrocannabinol POSITIVE (A) NONE DETECTED   Barbiturates NONE DETECTED NONE DETECTED    Comment: (NOTE) DRUG SCREEN FOR MEDICAL PURPOSES ONLY.  IF CONFIRMATION IS NEEDED FOR ANY PURPOSE, NOTIFY LAB WITHIN 5 DAYS. LOWEST DETECTABLE LIMITS FOR URINE DRUG SCREEN Drug Class                     Cutoff (ng/mL) Amphetamine and metabolites    1000 Barbiturate and metabolites    200 Benzodiazepine                 200 Tricyclics and metabolites     300 Opiates and metabolites        300 Cocaine and metabolites        300 THC                            50 Performed at [facility] Lab, [address] [place], [place] [address]     Blood Alcohol level:  Lab Results  Component Value Date   ETH <10 03/02/2019    Metabolic Disorder Labs:  No results found for: HGBA1C, MPG No results found for: PROLACTIN No results found for: CHOL, TRIG, HDL, CHOLHDL, VLDL, LDLCALC  Current Medications: Current Facility-Administered Medications  Medication Dose Route Frequency Provider Last Rate Last Dose  . acetaminophen (TYLENOL) tablet 650 mg  650 mg Oral Q6H PRN Maryagnes Amos, FNP      . alum & mag hydroxide-simeth (MAALOX/MYLANTA) 200-200-20 MG/5ML suspension 30 mL  30 mL Oral Q4H PRN Rosario Adie, Juel Burrow, FNP      . hydrOXYzine (ATARAX/VISTARIL) tablet 25 mg  25 mg Oral TID PRN Maryagnes Amos, FNP   25 mg at 03/02/19 2135  . magnesium hydroxide (MILK OF MAGNESIA) suspension 30 mL  30 mL Oral Daily PRN Starkes-Perry, Juel Burrow, FNP      . traZODone (DESYREL) tablet 100 mg  100 mg Oral QHS PRN Maryagnes Amos, FNP   100 mg at 03/02/19 2135   PTA Medications: Medications Prior to Admission  Medication Sig Dispense Refill Last Dose  . citalopram (CELEXA) 20 MG tablet Take 20 mg by mouth at bedtime.   Unknown at Unknown time  . diphenhydramine-acetaminophen (TYLENOL PM) 25-500 MG TABS tablet Take  2 tablets by mouth at bedtime as needed (sleep).   Unknown at Unknown time    Musculoskeletal: Strength & Muscle Tone: within normal limits Gait & Station: normal Patient leans: N/A  Psychiatric Specialty Exam: Physical Exam  Constitutional: He is oriented to person, place, and time. He appears well-developed and well-nourished. No distress.  HENT:  Head: Normocephalic and atraumatic.  Right Ear: External ear normal.  Left Ear: External ear normal.  Eyes: Pupils are equal, round, and reactive to light. Right eye exhibits no discharge. Left eye exhibits no discharge.  Respiratory: Effort normal. No respiratory distress.  Musculoskeletal: Normal range of motion.  Neurological: He is alert and oriented to person, place, and time.  Skin: He is not diaphoretic.    Review of Systems  Constitutional: Negative for chills, diaphoresis, fever, malaise/fatigue and weight  loss.  Respiratory: Negative for cough and shortness of breath.   Cardiovascular: Negative for chest pain.  Gastrointestinal: Negative for diarrhea, nausea and vomiting.  Psychiatric/Behavioral: Positive for depression, substance abuse and suicidal ideas. Negative for hallucinations and memory loss. The patient is nervous/anxious and has insomnia.     Blood pressure 110/80, pulse 94, temperature 98.1 F (36.7 C), temperature source Oral, resp. rate 20, SpO2 99 %.There is no height or weight on file to calculate BMI.  General Appearance: Casual  Eye Contact:  Good  Speech:  Clear and Coherent and Normal Rate  Volume:  Normal  Mood:  Anxious and Depressed  Affect:  Congruent and Full Range  Thought Process:  Coherent, Goal Directed and Descriptions of Associations: Intact  Orientation:  Full (Time, Place, and Person)  Thought Content:  Logical and Hallucinations: None  Suicidal Thoughts:  No  Homicidal Thoughts:  No  Memory:  Immediate;   Good  Judgement:  Fair  Insight:  Fair  Psychomotor Activity:  Normal   Concentration:  Concentration: Good  Recall:  Good  Fund of Knowledge:  Good  Language:  Good  Akathisia:  Negative  Handed:  Right  AIMS (if indicated):     Assets:  Communication Skills Desire for Improvement Intimacy Leisure Time Physical Health  ADL's:  Intact  Cognition:  WNL  Sleep:       Treatment Plan Summary: Daily contact with patient to assess and evaluate symptoms and progress in treatment, Medication management and Plan Recommend admit to observation. will need short term drug rehab services. wil resume home medications at this time, once admitted to the unit. he recently overdosed on celexa will not restart this medication today.   Disposition: Supportive therapy provided about ongoing stressors. Refer to IOP. Discussed crisis plan, support from social network, calling 911, coming to the Emergency Department, and calling Suicide Hotline. admit to OBS    Jackelyn Poling, NP 10/24/202012:44 AM

## 2019-03-03 NOTE — Progress Notes (Signed)
D: Pt A & O X 4. Denies SI, HI, AVH and pain at this time.Verbally contracts for safety. Presents anxious but excited on interaction about d/c "I'm ready to go home" with a fixed smile. Speech is logical and clear. Pt was D/C home as ordered. Picked up in front of facility by his wife. A: D/C instructions reviewed with pt including instructions to follow up with [facility] for mental health needs and restarting his medication. Pt was given resources for substance abuse facility and [facility] for medical services as well; compliance encouraged. All belongings from locker 49 returned to pt at time of departure. Safety checks maintained without incident till time of d/c.  R: Pt receptive to care. Verbalized understanding related to d/c instructions. Signed belonging sheet in agreement with items received from locker. Ambulatory with a steady gait. Appears to be in no physical distress at time of departure.

## 2019-03-03 NOTE — Consult Note (Signed)
[facility] Psych OBS Discharge  03/03/2019 10:28 AM Alexander Garrison  MRN:  [id number] Principal Problem: Substance induced mood disorder [facility]) Discharge Diagnoses: Principal Problem:   Substance induced mood disorder (HCC) Active Problems:   MDD (major depressive disorder), recurrent episode, severe (HCC)   Polysubstance abuse (HCC)   Subjective: Patient is a 38 year-old male that presented as a walk-in after reporting that he was on drugs and he tried to hurt himself.  He states that approximately 2 days ago he self-reported that he took 100 mg of Celexa.  He reports that this only happened because he was high on drugs at the time and states that prior to that he has never attempted to harm himself and currently has no thoughts of wanting to harm himself or anyone else.  He states that the only time he said that thought was 2 days ago when he was high.  Patient has not shown any signs or symptoms of having issues from this.  He states that he just wants to get back on his prescription medications.  He states that he moved down here approximately 2 months ago and is been staying in an apartment with some other friends.  He states that his had a job at [employer] and that is worked out really well for him.  He states that his biggest problem is that he just needed to stop using drugs.  He reports that he would like to go to an outpatient service but he does not know where he can go since he does not have any insurance.  Patient is requesting to have some outpatient resources provided to him.  Patient denies having any suicidal homicidal ideations and denies any hallucinations.  Total Time spent with patient: 30 minutes  Past Psychiatric History: Depression, anxiety, substance abuse, cocaine use disorder  Past Medical History:  Past Medical History:  Diagnosis Date  . Anxiety   . Depression     Past Surgical History:  Procedure Laterality Date  . HERNIA REPAIR     Family History:  History reviewed. No pertinent family history. Family Psychiatric  History: As per patient mother takes depression medicine.  He denies any suicide attempts and/or completions by family. Social History:  Social History   Substance and Sexual Activity  Alcohol Use Yes   Comment: occassional      Social History   Substance and Sexual Activity  Drug Use Yes  . Types: Cocaine, Marijuana, Methamphetamines   Comment: THC daily and cocaine/meth 1-2 x week    Social History   Socioeconomic History  . Marital status: Single    Spouse name: Not on file  . Number of children: Not on file  . Years of education: Not on file  . Highest education level: Not on file  Occupational History  . Not on file  Social Needs  . Financial resource strain: Not on file  . Food insecurity    Worry: Not on file    Inability: Not on file  . Transportation needs    Medical: Not on file    Non-medical: Not on file  Tobacco Use  . Smoking status: Current Every Day Smoker    Types: Cigarettes  . Smokeless tobacco: Never Used  Substance and Sexual Activity  . Alcohol use: Yes    Comment: occassional   . Drug use: Yes    Types: Cocaine, Marijuana, Methamphetamines    Comment: THC daily and cocaine/meth 1-2 x week  . Sexual activity: Not  on file  Lifestyle  . Physical activity    Days per week: Not on file    Minutes per session: Not on file  . Stress: Not on file  Relationships  . Social Musicianconnections    Talks on phone: Not on file    Gets together: Not on file    Attends religious service: Not on file    Active member of club or organization: Not on file    Attends meetings of clubs or organizations: Not on file    Relationship status: Not on file  Other Topics Concern  . Not on file  Social History Narrative  . Not on file    Has this patient used any form of tobacco in the last 30 days? (Cigarettes, Smokeless Tobacco, Cigars, and/or Pipes) A prescription for an FDA-approved tobacco  cessation medication was offered at discharge and the patient refused  Current Medications: Current Facility-Administered Medications  Medication Dose Route Frequency Provider Last Rate Last Dose  . acetaminophen (TYLENOL) tablet 650 mg  650 mg Oral Q6H PRN Maryagnes AmosStarkes-Perry, Takia S, FNP      . alum & mag hydroxide-simeth (MAALOX/MYLANTA) 200-200-20 MG/5ML suspension 30 mL  30 mL Oral Q4H PRN Rosario AdieStarkes-Perry, Juel Burrowakia S, FNP      . hydrOXYzine (ATARAX/VISTARIL) tablet 25 mg  25 mg Oral TID PRN Maryagnes AmosStarkes-Perry, Takia S, FNP   25 mg at 03/02/19 2135  . magnesium hydroxide (MILK OF MAGNESIA) suspension 30 mL  30 mL Oral Daily PRN Starkes-Perry, Juel Burrowakia S, FNP      . traZODone (DESYREL) tablet 100 mg  100 mg Oral QHS PRN Maryagnes AmosStarkes-Perry, Takia S, FNP   100 mg at 03/02/19 2135   PTA Medications: Medications Prior to Admission  Medication Sig Dispense Refill Last Dose  . citalopram (CELEXA) 20 MG tablet Take 20 mg by mouth at bedtime.   Unknown at Unknown time  . diphenhydramine-acetaminophen (TYLENOL PM) 25-500 MG TABS tablet Take 2 tablets by mouth at bedtime as needed (sleep).   Unknown at Unknown time    Musculoskeletal: Strength & Muscle Tone: within normal limits Gait & Station: normal Patient leans: N/A  Psychiatric Specialty Exam: Physical Exam  Nursing note and vitals reviewed. Constitutional: He is oriented to person, place, and time. He appears well-developed and well-nourished.  Cardiovascular: Normal rate.  Respiratory: Effort normal.  Musculoskeletal: Normal range of motion.  Neurological: He is alert and oriented to person, place, and time.  Skin: Skin is warm.    Review of Systems  Constitutional: Negative.   HENT: Negative.   Eyes: Negative.   Respiratory: Negative.   Cardiovascular: Negative.   Gastrointestinal: Negative.   Genitourinary: Negative.   Musculoskeletal: Negative.   Skin: Negative.   Neurological: Negative.   Endo/Heme/Allergies: Negative.    Psychiatric/Behavioral: Positive for substance abuse.    Blood pressure 110/80, pulse 94, temperature 98.1 F (36.7 C), temperature source Oral, resp. rate 20, SpO2 99 %.There is no height or weight on file to calculate BMI.  General Appearance: Casual  Eye Contact:  Good  Speech:  Clear and Coherent and Normal Rate  Volume:  Normal  Mood:  Euthymic  Affect:  Congruent  Thought Process:  Coherent and Descriptions of Associations: Intact  Orientation:  Full (Time, Place, and Person)  Thought Content:  WDL  Suicidal Thoughts:  No  Homicidal Thoughts:  No  Memory:  Immediate;   Good Recent;   Good Remote;   Good  Judgement:  Fair  Insight:  Fair  Psychomotor  Activity:  Normal  Concentration:  Concentration: Good  Recall:  Good  Fund of Knowledge:  Good  Language:  Good  Akathisia:  No  Handed:  Right  AIMS (if indicated):     Assets:  Communication Skills Desire for Improvement Housing Resilience Social Support  ADL's:  Intact  Cognition:  WNL  Sleep:      Assessment: Patient presents in his room lying in the bed and is pleasant, calm, cooperative.  Patient continues denying having any suicidal homicidal ideations and continues reporting that this only happened because he was high on drugs.  Patient states that he does want to get back to work and he has plans to get back on his prescription medications.  He states he has not followed up with anyone since he moved here 2 months ago and is provided with outpatient resources for facilities that will accept him with no insurance.  Patient states that he has been living in an apartment with a couple other guys so that he can go to work and have a place to live.  He states that he can return there.  He states that they are good guys but he just relapsed on drugs and he knows that it was a mistake.  Demographic Factors:  Male, Caucasian and Low socioeconomic status  Loss Factors: NA  Historical Factors: Family history of mental  illness or substance abuse  Risk Reduction Factors:   Employed, Living with another person, especially a relative, Positive social support and Positive coping skills or problem solving skills  Continued Clinical Symptoms:  Alcohol/Substance Abuse/Dependencies  Cognitive Features That Contribute To Risk:  None    Suicide Risk:  Minimal: No identifiable suicidal ideation.  Patients presenting with no risk factors but with morbid ruminations; may be classified as minimal risk based on the severity of the depressive symptoms    Plan Of Care/Follow-up recommendations: Patient provided with resources for outpatient resources that provide services without insurance.  Continue activity as tolerated. Continue diet as recommended by your PCP. Ensure to keep all appointments with outpatient providers.  Disposition: Discharge patient home with resources. Patient needs to follow up with outpatient provider for medication prescriptions. At this time the patient does not meet inpatient criteria and is psychiatrically cleared. Dr. Mallie Darting has been consulted on this patient. [place], FNP 03/03/2019, 10:28 AM

## 2019-03-03 NOTE — Progress Notes (Signed)
[facility] NOVEL CORONAVIRUS (COVID-19) DAILY CHECK-OFF SYMPTOMS - answer yes or no to each - every day NO YES  Have you had a fever in the past 24 hours?  . Fever (Temp > 37.80C / 100F) X   Have you had any of these symptoms in the past 24 hours? . New Cough .  Sore Throat  .  Shortness of Breath .  Difficulty Breathing .  Unexplained Body Aches   X   Have you had any one of these symptoms in the past 24 hours not related to allergies?   . Runny Nose .  Nasal Congestion .  Sneezing   X   If you have had runny nose, nasal congestion, sneezing in the past 24 hours, has it worsened?  X   EXPOSURES - check yes or no X   Have you traveled outside the state in the past 14 days?  X   Have you been in contact with someone with a confirmed diagnosis of COVID-19 or PUI in the past 14 days without wearing appropriate PPE?  X   Have you been living in the same home as a person with confirmed diagnosis of COVID-19 or a PUI (household contact)?    X   Have you been diagnosed with COVID-19?    X              What to do next: Answered NO to all: Answered YES to anything:   Proceed with unit schedule Follow the [facility] Inpatient Flowsheet.   

## 2019-03-04 NOTE — Discharge Summary (Signed)
Physician Discharge Summary Note  Patient:  Alexander Garrison is an 38 y.o., male MRN:  [id number] DOB:  July 02, 1980 Patient phone:  [phone] (home)  Patient address:   [address] [place] [address],  Total Time spent with patient: 30 minutes  Date of Admission:  03/02/2019 Date of Discharge: 03/03/2019  Reason for Admission:  Reports took 100 mg of Celexa 2 days ago while intoxicated  Principal Problem: Substance induced mood disorder [facility]) Discharge Diagnoses: Principal Problem:   Substance induced mood disorder ([place]) Active Problems:   MDD (major depressive disorder), recurrent episode, severe ([place])   Polysubstance abuse ([place])   Past Psychiatric History: Depression, anxiety, substance abuse, cocaine use disorder  Past Medical History:  Past Medical History:  Diagnosis Date  . Anxiety   . Depression     Past Surgical History:  Procedure Laterality Date  . HERNIA REPAIR     Family History: History reviewed. No pertinent family history. Family Psychiatric  History: As per patient mother takes depression medicine. He denies any suicide attempts and/or completions by family Social History:  Social History   Substance and Sexual Activity  Alcohol Use Yes   Comment: occassional      Social History   Substance and Sexual Activity  Drug Use Yes  . Types: Cocaine, Marijuana, Methamphetamines   Comment: THC daily and cocaine/meth 1-2 x week    Social History   Socioeconomic History  . Marital status: Single    Spouse name: Not on file  . Number of children: Not on file  . Years of education: Not on file  . Highest education level: Not on file  Occupational History  . Not on file  Social Needs  . Financial resource strain: Not on file  . Food insecurity    Worry: Not on file    Inability: Not on file  . Transportation needs    Medical: Not on file    Non-medical: Not on file  Tobacco Use  . Smoking status: Current Every Day Smoker    Types:  Cigarettes  . Smokeless tobacco: Never Used  Substance and Sexual Activity  . Alcohol use: Yes    Comment: occassional   . Drug use: Yes    Types: Cocaine, Marijuana, Methamphetamines    Comment: THC daily and cocaine/meth 1-2 x week  . Sexual activity: Not on file  Lifestyle  . Physical activity    Days per week: Not on file    Minutes per session: Not on file  . Stress: Not on file  Relationships  . Social Herbalist on phone: Not on file    Gets together: Not on file    Attends religious service: Not on file    Active member of club or organization: Not on file    Attends meetings of clubs or organizations: Not on file    Relationship status: Not on file  Other Topics Concern  . Not on file  Social History Narrative  . Not on file    Hospital Course:   Patient is a 38 year-old male that presented as a walk-in after reporting that he was on drugs and he tried to hurt himself.  He states that approximately 2 days ago he self-reported that he took 100 mg of Celexa.  He reports that this only happened because he was high on drugs at the time and states that prior to that he has never attempted to harm himself and currently has no  thoughts of wanting to harm himself or anyone else.  He states that the only time he said that thought was 2 days ago when he was high.  Patient has not shown any signs or symptoms of having issues from this.  He states that he just wants to get back on his prescription medications.  He states that he moved down here approximately 2 months ago and is been staying in an apartment with some other friends.  He states that his had a job at [employer] and that is worked out really well for him.  He states that his biggest problem is that he just needed to stop using drugs.  He reports that he would like to go to an outpatient service but he does not know where he can go since he does not have any insurance.  Patient is requesting to have some  outpatient resources provided to him.  Patient denies having any suicidal homicidal ideations and denies any hallucinations.  Assessment: Patient presents in his room lying in the bed and is pleasant, calm, cooperative.  Patient continues denying having any suicidal homicidal ideations and continues reporting that this only happened because he was high on drugs.  Patient states that he does want to get back to work and he has plans to get back on his prescription medications.  He states he has not followed up with anyone since he moved here 2 months ago and is provided with outpatient resources for facilities that will accept him with no insurance.  Patient states that he has been living in an apartment with a couple other guys so that he can go to work and have a place to live.  He states that he can return there.  He states that they are good guys but he just relapsed on drugs and he knows that it was a mistake. Dr. Jola Babinski was consulted on this patient.  Physical Findings: AIMS: Facial and Oral Movements Muscles of Facial Expression: None, normal Lips and Perioral Area: None, normal Jaw: None, normal Tongue: None, normal,Extremity Movements Upper (arms, wrists, hands, fingers): None, normal Lower (legs, knees, ankles, toes): None, normal, Trunk Movements Neck, shoulders, hips: None, normal, Overall Severity Severity of abnormal movements (highest score from questions above): None, normal Incapacitation due to abnormal movements: None, normal Patient's awareness of abnormal movements (rate only patient's report): No Awareness, Dental Status Current problems with teeth and/or dentures?: No Does patient usually wear dentures?: No  CIWA:  CIWA-Ar Total: 1 COWS:     Musculoskeletal: Strength & Muscle Tone: within normal limits Gait & Station: normal Patient leans: N/A  Psychiatric Specialty Exam: See OBS Discharge Note Physical Exam  ROS  Blood pressure 93/63, pulse (!) 102, temperature 98.2  F (36.8 C), temperature source Oral, resp. rate 20, SpO2 99 %.There is no height or weight on file to calculate BMI.  Sleep:           Has this patient used any form of tobacco in the last 30 days? (Cigarettes, Smokeless Tobacco, Cigars, and/or Pipes) Yes, Yes, A prescription for an FDA-approved tobacco cessation medication was offered at discharge and the patient refused  Blood Alcohol level:  Lab Results  Component Value Date   [facility] <10 03/02/2019    Metabolic Disorder Labs:  Lab Results  Component Value Date   HGBA1C 5.4 03/03/2019   MPG 108.28 03/03/2019   No results found for: PROLACTIN Lab Results  Component Value Date   CHOL 240 (H) 03/03/2019  TRIG 135 03/03/2019   HDL 40 (L) 03/03/2019   CHOLHDL 6.0 03/03/2019   VLDL 27 03/03/2019   LDLCALC 173 (H) 03/03/2019    See Psychiatric Specialty Exam and Suicide Risk Assessment completed by Attending Physician prior to discharge.  Discharge destination:  Home  Is patient on multiple antipsychotic therapies at discharge:  No   Has Patient had three or more failed trials of antipsychotic monotherapy by history:  No  Recommended Plan for Multiple Antipsychotic Therapies: NA   Allergies as of 03/03/2019   No Known Allergies     Medication List    STOP taking these medications   diphenhydramine-acetaminophen 25-500 MG Tabs tablet Commonly known as: TYLENOL PM     TAKE these medications     Indication  citalopram 20 MG tablet Commonly known as: CELEXA Take 20 mg by mouth at bedtime.  Indication: Depression        Follow-up recommendations:  Continue activity as tolerated. Continue diet as recommended by your PCP. Ensure to keep all appointments with outpatient providers.  Comments:  Patient is instructed prior to discharge to: Take all medications as prescribed by his/her mental healthcare provider. Report any adverse effects and or reactions from the medicines to his/her outpatient provider  promptly. Patient has been instructed & cautioned: To not engage in alcohol and or illegal drug use while on prescription medicines. In the event of worsening symptoms, patient is instructed to call the crisis hotline, 911 and or go to the nearest ED for appropriate evaluation and treatment of symptoms. To follow-up with his/her primary care provider for your other medical issues, concerns and or health care needs.    Signed: Gerlene Burdockravis B Zaiyah Sottile, FNP 03/04/2019, 10:16 AM

## 2021-02-16 IMAGING — DX DG CHEST 2V
2 series · 2 of 2 positions shown · non-contrast
Comparison: None.

CLINICAL DATA: Chest pain

EXAM:
CHEST - 2 VIEW

[chest pa]
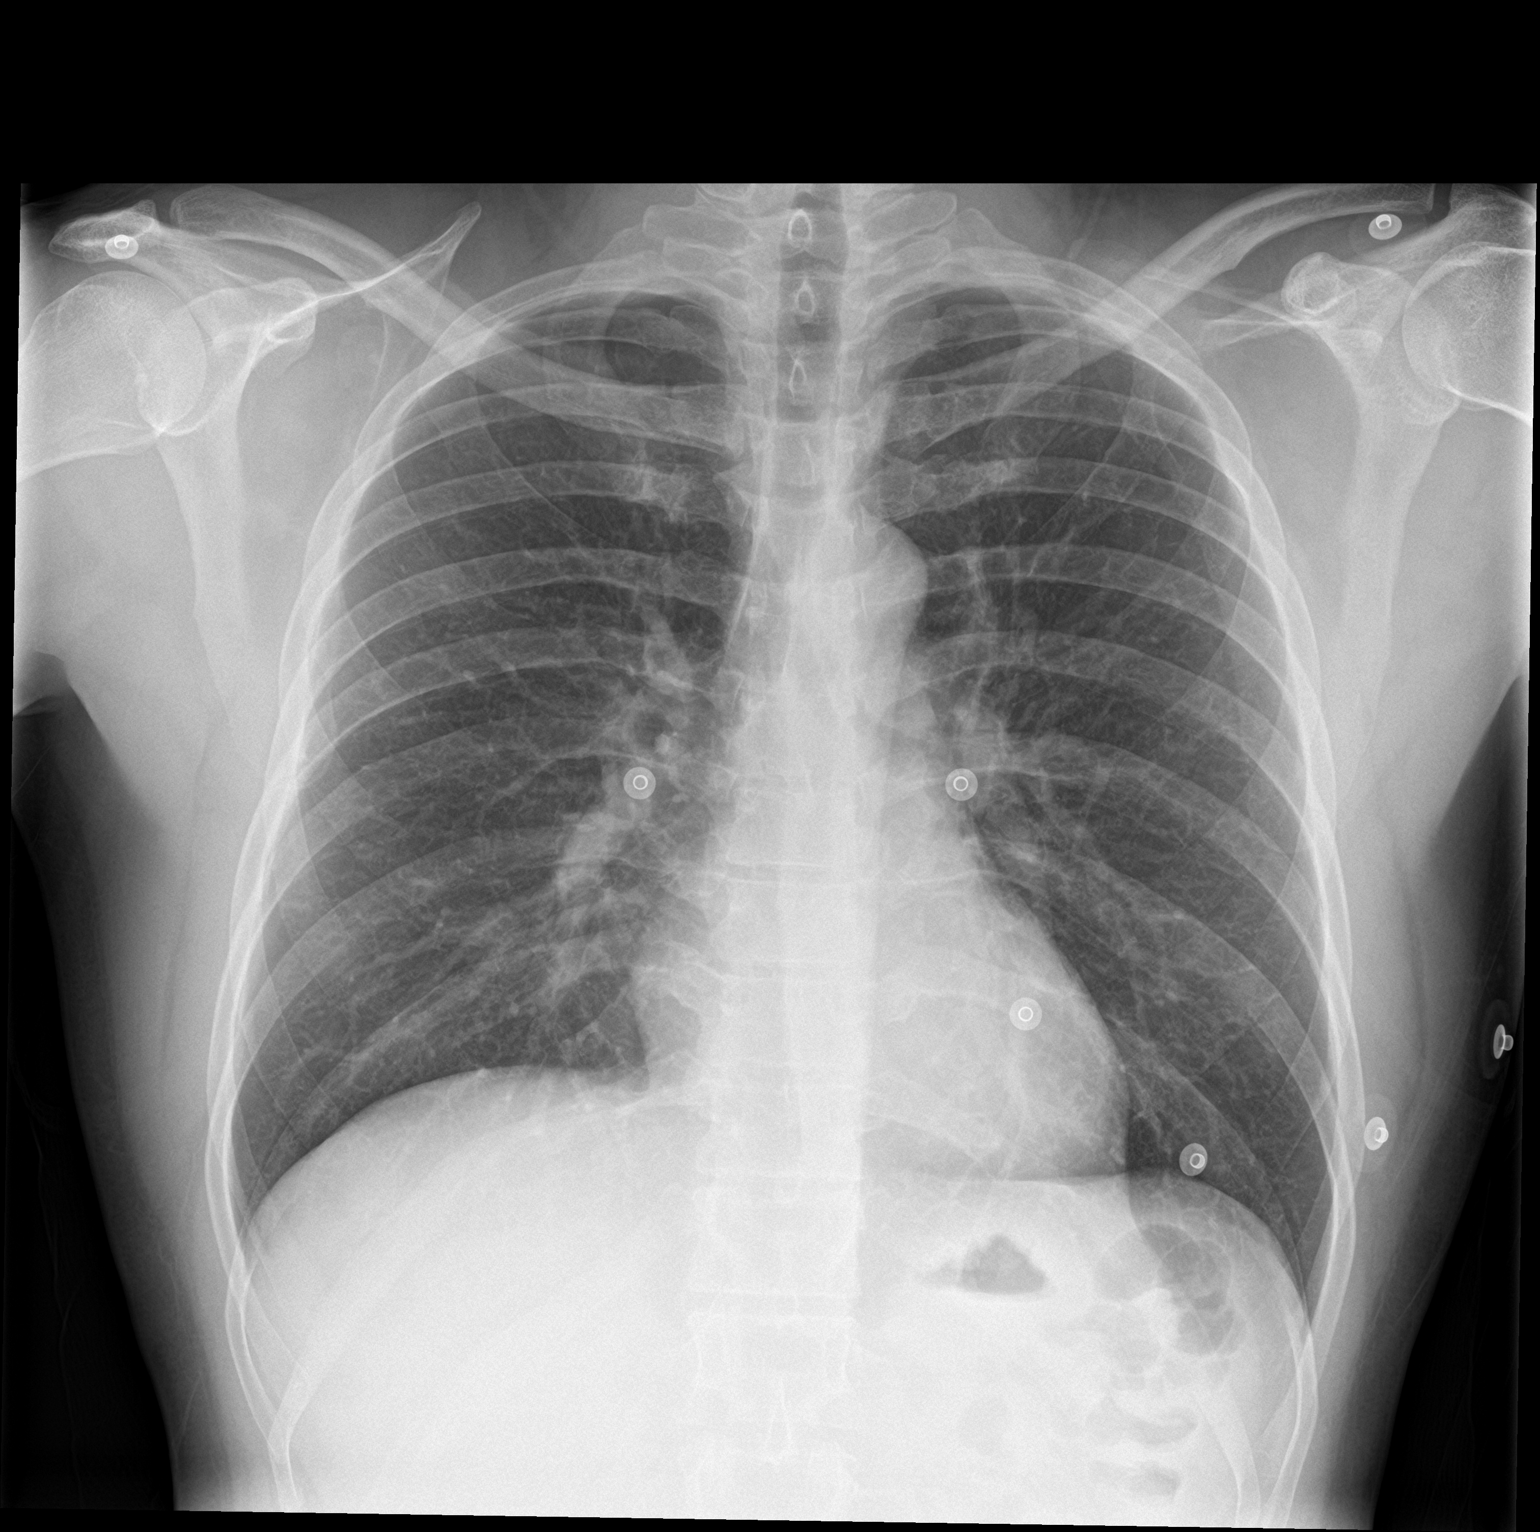

[chest lat]
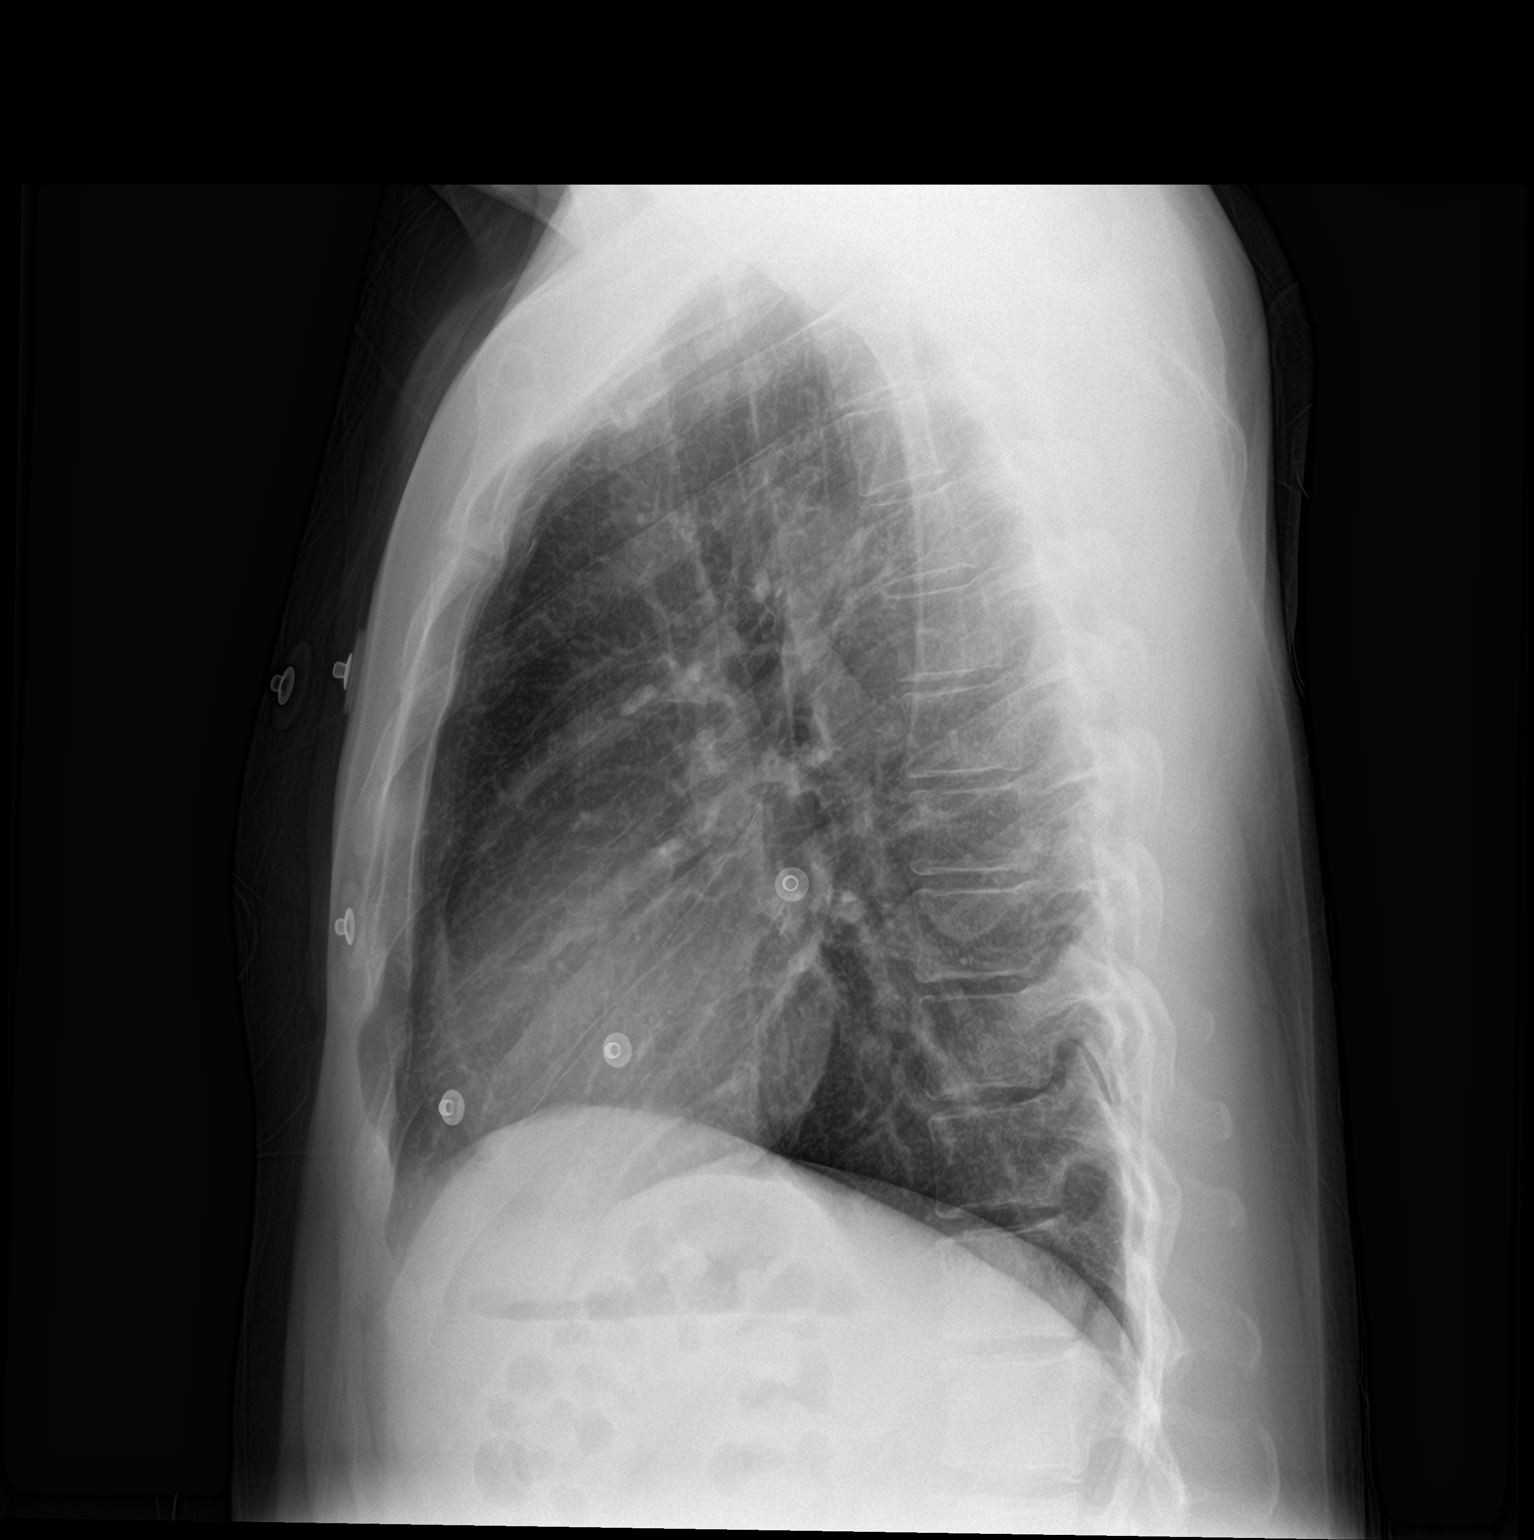

[2 of 2 positions shown; findings below may reference images not displayed]

FINDINGS: Normal heart size and mediastinal contours. No acute infiltrate or
edema. No effusion or pneumothorax. No acute osseous findings.
IMPRESSION: Negative chest.
